# Patient Record
Sex: Female | Born: 1950 | Race: White | Hispanic: No | Marital: Single
Health system: Southern US, Community
[De-identification: ages and names within clinical notes are randomized; demographics above are authoritative.]

---

## 2020-09-20 ENCOUNTER — Other Ambulatory Visit: Payer: Self-pay | Admitting: Orthopaedic Surgery

## 2020-09-20 ENCOUNTER — Ambulatory Visit
Admission: RE | Admit: 2020-09-20 | Discharge: 2020-09-20 | Disposition: A | Discharge status: Home / Self Care | Payer: Self-pay | Source: Ambulatory Visit | Attending: Orthopaedic Surgery | Admitting: Orthopaedic Surgery

## 2020-09-20 DIAGNOSIS — M545 Low back pain, unspecified: Secondary | ICD-10-CM

## 2020-10-10 ENCOUNTER — Other Ambulatory Visit: Payer: Self-pay | Admitting: Orthopaedic Surgery

## 2020-10-10 DIAGNOSIS — M5451 Vertebrogenic low back pain: Secondary | ICD-10-CM

## 2020-10-10 DIAGNOSIS — M545 Low back pain, unspecified: Secondary | ICD-10-CM

## 2020-10-25 ENCOUNTER — Ambulatory Visit: Payer: Self-pay

## 2020-11-03 ENCOUNTER — Telehealth: Payer: Self-pay

## 2020-11-03 NOTE — Telephone Encounter (Signed)
Called and spoke with patient and reviewed instructions for procedure (myelogram) for 11/09/2020. Discussed NPO post midnight 11/08/2020, wear comfortable clothing, someone needs to drive her home, and stop taking Cyclobenzaprine 48 hours prior to procedure, which will be last dose 11/06/2020.  Patient verbalized understanding of all instructions and to arrive at 8am for 8:30am procedure.

## 2020-11-09 ENCOUNTER — Ambulatory Visit
Admission: RE | Admit: 2020-11-09 | Discharge: 2020-11-09 | Disposition: A | Discharge status: Home / Self Care | Payer: Federal, State, Local not specified - PPO | Source: Ambulatory Visit | Attending: Orthopaedic Surgery | Admitting: Orthopaedic Surgery

## 2020-11-09 ENCOUNTER — Other Ambulatory Visit: Payer: Self-pay

## 2020-11-09 DIAGNOSIS — M545 Low back pain, unspecified: Secondary | ICD-10-CM

## 2020-11-09 DIAGNOSIS — M5451 Vertebrogenic low back pain: Secondary | ICD-10-CM | POA: Insufficient documentation

## 2020-11-09 MED ORDER — IOHEXOL 180 MG/ML  SOLN
20.0000 mL | Freq: Once | INTRAMUSCULAR | Status: AC | PRN
  Administered 2020-11-09: 20 mL

## 2020-11-09 MED ORDER — LIDOCAINE HCL (PF) 1 % IJ SOLN
5.0000 mL | Freq: Once | INTRAMUSCULAR | Status: AC
  Administered 2020-11-09: 5 mL
  Filled 2020-11-09: qty 5

## 2020-11-09 NOTE — Procedures (Signed)
Successful fluoroscopic guided lumbar myelogram. No complications. Patient was taken to CT for additional imaging. EBL: N/A See PACS for full report.  Pattricia Boss D, PA-C 11/09/2020, 9:10 AM

## 2021-01-02 DEATH — deceased

## 2022-09-12 IMAGING — CT CT L SPINE W/ CM
3 of 4 series · 11 of 33 positions shown, 13 images · non-contrast
Comparison: Pelvis radiographs 07/07/2020 lumbar spine MRI
12/15/2018

CLINICAL DATA: Patient complains lower back pain starting in
[REDACTED] with associated symptoms of weakness with inability to stand
for long periods of time and pain that radiates down the left lower
extremity. Patient did have a L4-L5 laminectomy and fusion in
[REDACTED]. Patient denies any new injury since surgery.
TECHNIQUE: Contiguous axial images were obtained through the Lumbar spine after
the intrathecal infusion of infusion. Coronal and sagittal
reconstructions were obtained of the axial image sets.

[Series 3: multi disc · axial · 0.21mm/px · z∈[-650,-533]mm · 3 of 91 slices shown, 4 images]
[im 16/91  soft-tissue]
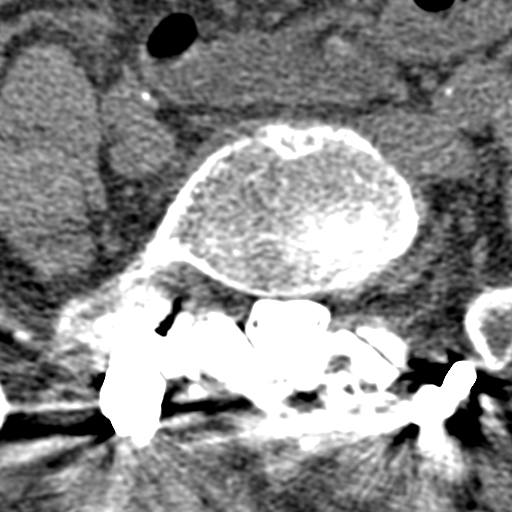
[im 16/91  bone]
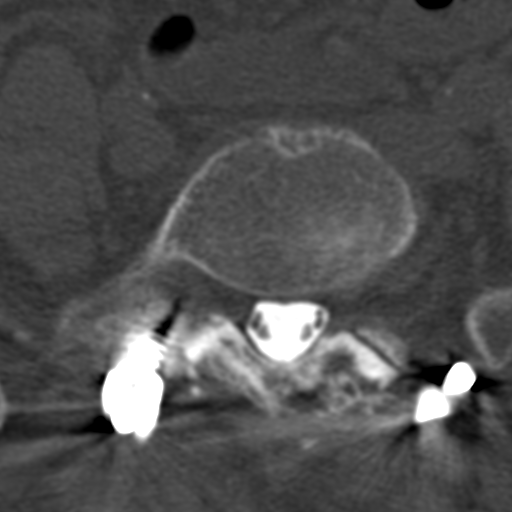
[im 46/91  bone]
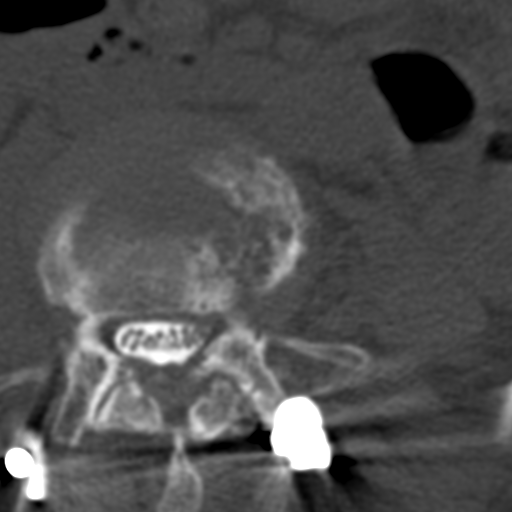
[im 76/91  bone]
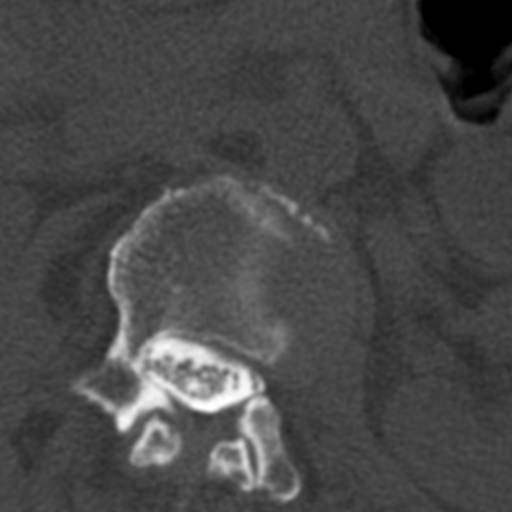

[Series 6: coronal bone · coronal · 0.29mm/px · 3 of 67 slices shown]
[im 14/67  bone]
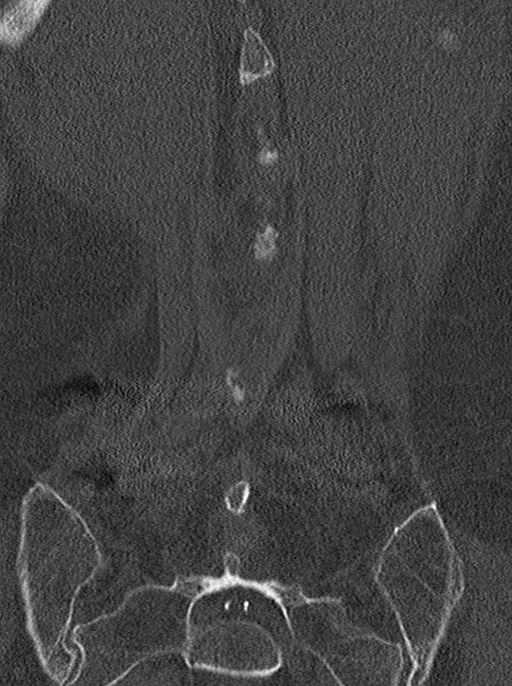
[im 27/67  bone]
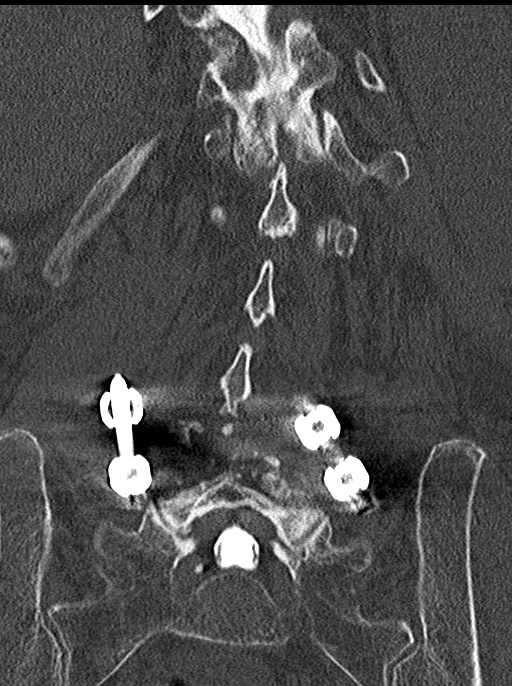
[im 40/67  bone]
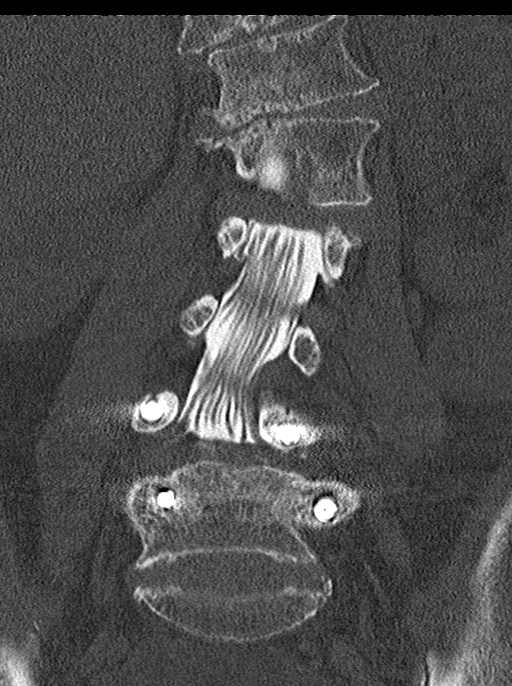

[Series 7: sagittal st · sagittal · 0.28mm/px · 5 of 76 slices shown, 6 images]
[im 26/76  bone]
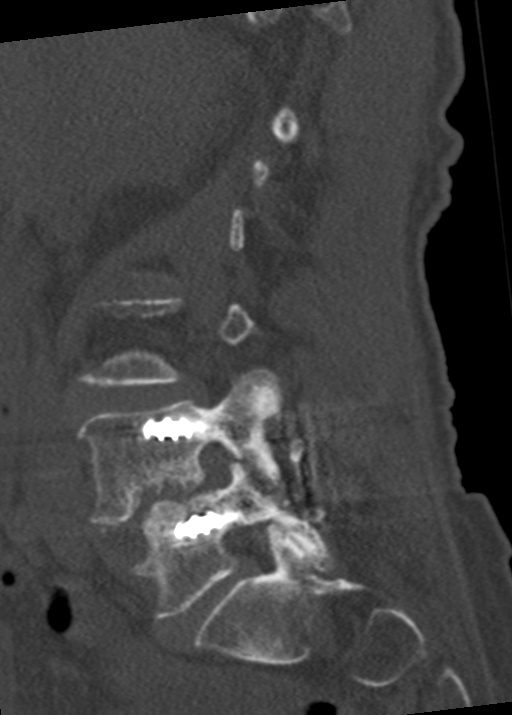
[im 32/76  bone]
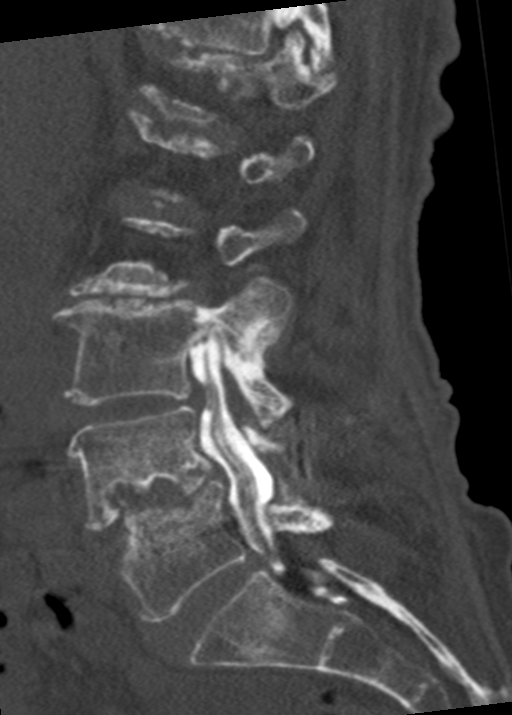
[im 38/76  soft-tissue]
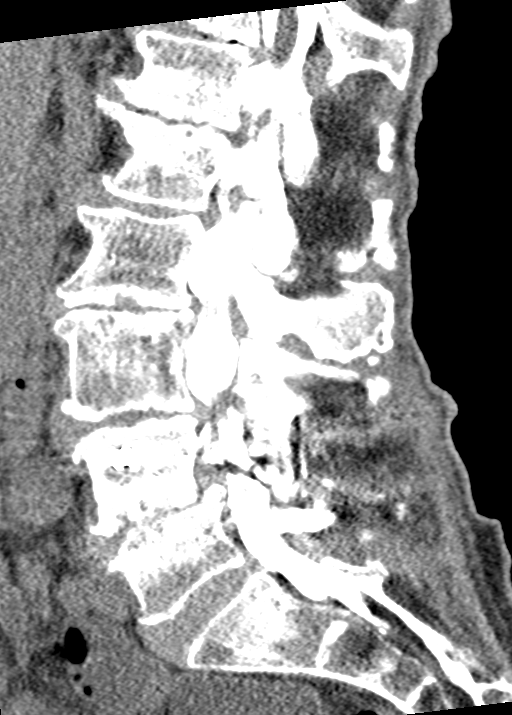
[im 38/76  bone]
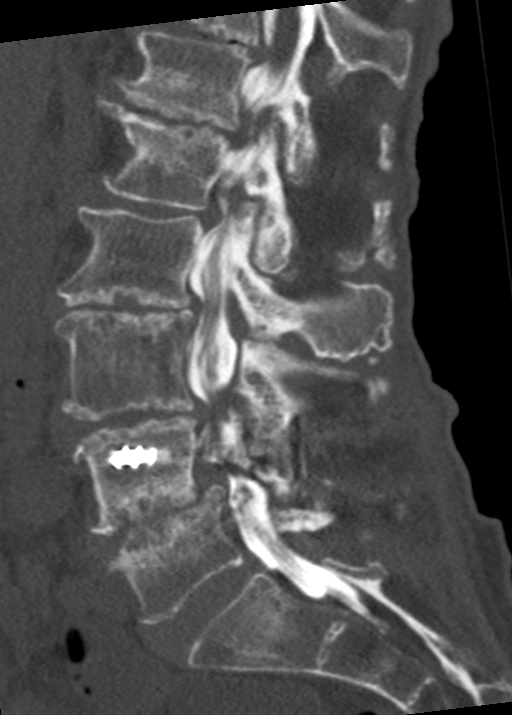
[im 44/76  bone]
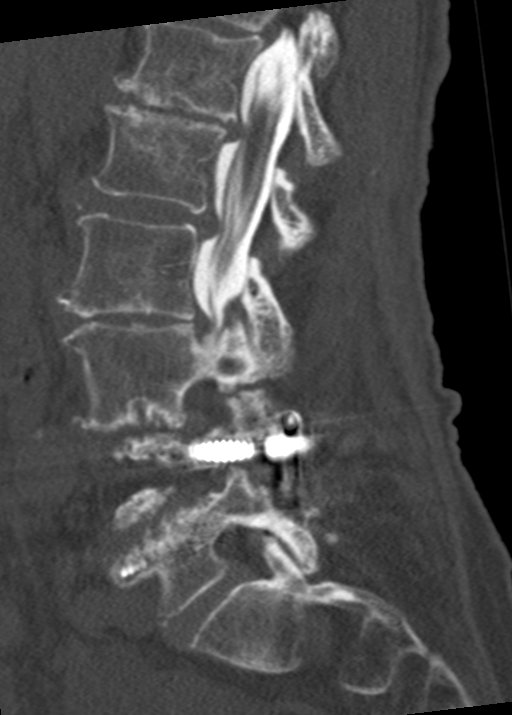
[im 51/76  bone]
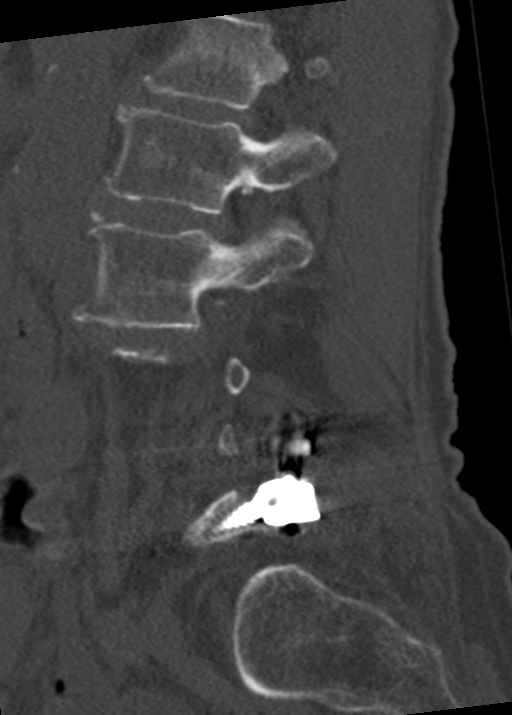

[11 of 33 positions shown; findings below may reference images not displayed]

EXAM:
LUMBAR MYELOGRAM, CT MYELOGRAPHY LUMBAR SPINE

FLUOROSCOPY TIME:  0.5 minute, 6.8 mGy

PROCEDURE:
After thorough discussion of risks and benefits of the procedure
including bleeding, infection, injury to nerves, blood vessels,
adjacent structures as well as headache and CSF leak, written and
oral informed consent was obtained. Consent was obtained by [facility] [place] [facility]
Adolfo Miguel Blume. Time out form was completed.

Patient was positioned prone on the fluoroscopy table. Local
anesthesia was provided with 1% lidocaine without epinephrine after
prepped and draped in the usual sterile fashion. Puncture was
performed at L4-L5 using a 3 1/2 inch 22-gauge spinal needle. Using
a single pass through the dura, the needle was placed within the
thecal sac, with return of clear CSF. 15 mL of OMNIPAQUE 180 was
injected into the thecal sac, with normal opacification of the nerve
roots and cauda equina consistent with free flow within the
subarachnoid space.

I personally performed the lumbar puncture and administered the
intrathecal contrast.
FINDINGS: Segmentation: 5 lumbar type vertebral bodies.

Alignment: There is levoscoliosis of the lumbar spine with the apex
at L1-L2. There is trace retrolisthesis of T12 on L1 and 5 mm
anterolisthesis of L4 on L5, similar to the prior MRI. There is no
other anterior or retrolisthesis

Vertebrae: Vertebral body heights are preserved. There is endplate
irregularity at T12-L1, L2-L3, and L4-L5, likely degenerative in
nature. There is no suspicious osseous lesion.

Conus medullaris: Extends to the L1-L2 level and appears normal.

Paraspinal and other soft tissues: Unremarkable.

Disc levels:

The patient is status post L4-L5 TLIF with bilateral L4
laminectomies, new since 12/15/2018. Hardware alignment is within
expected limits.

There is marked intervertebral disc space narrowing with associated
endplate irregularity at T12-L1, L2-L3, and L4-L5, progressed at
L4-L5 since 3737.

T12-L1: There is a diffuse disc bulge, degenerative endplate change,
and mild facet arthropathy resulting in mild spinal canal stenosis
with crowding of the right subarticular zone but no evidence of
nerve root impingement and mild right and no significant left neural
foraminal stenosis. Findings are not significantly changed since

L1-L2: There is a diffuse disc bulge, degenerative endplate change,
and bilateral facet arthropathy resulting in mild spinal canal
stenosis with crowding of the right subarticular zone but no
evidence of nerve root impingement and mild right and no significant
left neural foraminal stenosis, not significantly changed.

L2-L3: There is a diffuse disc bulge, degenerative endplate change,
and bilateral facet arthropathy resulting in mild-to-moderate spinal
canal stenosis and moderate right and no significant left neural
foraminal stenosis. Findings are not significantly changed.

L3-L4: There is a diffuse disc bulge, ligamentum flavum thickening,
degenerative endplate change, and bilateral facet arthropathy
resulting in moderate spinal canal stenosis with crowding of the
subarticular zones, left worse than right, without evidence of nerve
root impingement, and mild bilateral neural foraminal stenosis.
Findings are not significantly changed.

L4-L5: The patient is status post posterior decompression, new since
12/15/2018. There is a diffuse disc bulge, degenerative endplate
change, and advanced facet arthropathy resulting in mild spinal
canal stenosis and moderate to severe left and moderate right neural
foraminal stenosis. Findings are significantly improved compared to
the preoperative study from 3737.

L5-S1: No significant spinal canal or neural foraminal stenosis.
IMPRESSION: LUMBAR MYELOGRAM IMPRESSION:

Technically successful lumbar myelogram.

CT LUMBAR MYELOGRAM IMPRESSION:
CT LUMBAR MYELOGRAM IMPRESSION
1. Status post L4-L5 TLIF with bilateral L4 laminectomies. Spinal
canal patency has markedly improved at L4-L5 compared to the
preoperative study from 3737.
2. Multilevel intervertebral disc space narrowing with associated
degenerative endplate change, most advanced at T12-L1 and L4-L5,
progressed since [DATE]. Moderate spinal canal stenosis and moderate right neural
foraminal stenosis at L2-L3, moderate spinal canal stenosis at
L3-L4, and moderate to severe left and moderate right neural
foraminal stenosis at L4-L5, overall not significantly changed since
